# Patient Record
Sex: Male | Born: 1995 | Race: White | Hispanic: No | Marital: Single
Health system: Southern US, Community
[De-identification: ages and names within clinical notes are randomized; demographics above are authoritative.]

## PROBLEM LIST (undated history)

## (undated) DIAGNOSIS — F909 Attention-deficit hyperactivity disorder, unspecified type: Secondary | ICD-10-CM

## (undated) DIAGNOSIS — E301 Precocious puberty: Secondary | ICD-10-CM

## (undated) DIAGNOSIS — F952 Tourette's disorder: Secondary | ICD-10-CM

## (undated) HISTORY — DX: Attention-deficit hyperactivity disorder, unspecified type: F90.9

## (undated) HISTORY — DX: Tourette's disorder: F95.2

## (undated) HISTORY — DX: Precocious puberty: E30.1

## (undated) HISTORY — PX: CIRCUMCISION REVISION: SHX1347

---
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
## 2015-05-13 ENCOUNTER — Ambulatory Visit: Payer: BLUE CROSS/BLUE SHIELD | Admitting: Family

## 2015-10-10 ENCOUNTER — Ambulatory Visit (INDEPENDENT_AMBULATORY_CARE_PROVIDER_SITE_OTHER): Payer: BLUE CROSS/BLUE SHIELD | Admitting: Pediatrics

## 2015-10-10 ENCOUNTER — Encounter: Payer: Self-pay | Admitting: Pediatrics

## 2015-10-10 VITALS — BP 110/80 | Ht 62.75 in | Wt 134.2 lb

## 2015-10-10 DIAGNOSIS — Z0000 Encounter for general adult medical examination without abnormal findings: Secondary | ICD-10-CM | POA: Diagnosis not present

## 2015-10-10 DIAGNOSIS — Z00129 Encounter for routine child health examination without abnormal findings: Secondary | ICD-10-CM

## 2015-10-10 DIAGNOSIS — Z68.41 Body mass index (BMI) pediatric, 5th percentile to less than 85th percentile for age: Secondary | ICD-10-CM

## 2015-10-10 MED ORDER — AMPHETAMINE-DEXTROAMPHETAMINE 20 MG PO TABS: 20.0000 mg | ORAL_TABLET | Freq: Every day | ORAL | Status: DC

## 2015-10-10 MED ORDER — LISDEXAMFETAMINE DIMESYLATE 40 MG PO CAPS: 40.0000 mg | ORAL_CAPSULE | Freq: Every day | ORAL | Status: DC

## 2015-10-10 NOTE — Patient Instructions (Signed)
Attention Deficit Hyperactivity Disorder  Attention deficit hyperactivity disorder (ADHD) is a problem with behavior issues based on the way the brain functions (neurobehavioral disorder). It is a common reason for behavior and academic problems in school.  SYMPTOMS   There are 3 types of ADHD. The 3 types and some of the symptoms include:  · Inattentive.    Gets bored or distracted easily.    Loses or forgets things. Forgets to hand in homework.    Has trouble organizing or completing tasks.    Difficulty staying on task.    An inability to organize daily tasks and school work.    Leaving projects, chores, or homework unfinished.    Trouble paying attention or responding to details. Careless mistakes.    Difficulty following directions. Often seems like is not listening.    Dislikes activities that require sustained attention (like chores or homework).  · Hyperactive-impulsive.    Feels like it is impossible to sit still or stay in a seat. Fidgeting with hands and feet.    Trouble waiting turn.    Talking too much or out of turn. Interruptive.    Speaks or acts impulsively.    Aggressive, disruptive behavior.    Constantly busy or on the go; noisy.    Often leaves seat when they are expected to remain seated.    Often runs or climbs where it is not appropriate, or feels very restless.  · Combined.    Has symptoms of both of the above.  Often children with ADHD feel discouraged about themselves and with school. They often perform well below their abilities in school.  As children get older, the excess motor activities can calm down, but the problems with paying attention and staying organized persist. Most children do not outgrow ADHD but with good treatment can learn to cope with the symptoms.  DIAGNOSIS   When ADHD is suspected, the diagnosis should be made by professionals trained in ADHD. This professional will collect information about the individual suspected of having ADHD. Information must be collected from  various settings where the person lives, works, or attends school.    Diagnosis will include:  · Confirming symptoms began in childhood.  · Ruling out other reasons for the child's behavior.  · The health care providers will check with the child's school and check their medical records.  · They will talk to teachers and parents.  · Behavior rating scales for the child will be filled out by those dealing with the child on a daily basis.  A diagnosis is made only after all information has been considered.  TREATMENT   Treatment usually includes behavioral treatment, tutoring or extra support in school, and stimulant medicines. Because of the way a person's brain works with ADHD, these medicines decrease impulsivity and hyperactivity and increase attention. This is different than how they would work in a person who does not have ADHD. Other medicines used include antidepressants and certain blood pressure medicines.  Most experts agree that treatment for ADHD should address all aspects of the person's functioning. Along with medicines, treatment should include structured classroom management at school. Parents should reward good behavior, provide constant discipline, and set limits. Tutoring should be available for the child as needed.  ADHD is a lifelong condition. If untreated, the disorder can have long-term serious effects into adolescence and adulthood.  HOME CARE INSTRUCTIONS   · Often with ADHD there is a lot of frustration among family members dealing with the condition. Blame   and anger are also feelings that are common. In many cases, because the problem affects the family as a whole, the entire family may need help. A therapist can help the family find better ways to handle the disruptive behaviors of the person with ADHD and promote change. If the person with ADHD is young, most of the therapist's work is with the parents. Parents will learn techniques for coping with and improving their child's behavior.  Sometimes only the child with the ADHD needs counseling. Your health care providers can help you make these decisions.  · Children with ADHD may need help learning how to organize. Some helpful tips include:  ¨ Keep routines the same every day from wake-up time to bedtime. Schedule all activities, including homework and playtime. Keep the schedule in a place where the person with ADHD will often see it. Mark schedule changes as far in advance as possible.  ¨ Schedule outdoor and indoor recreation.  ¨ Have a place for everything and keep everything in its place. This includes clothing, backpacks, and school supplies.  ¨ Encourage writing down assignments and bringing home needed books. Work with your child's teachers for assistance in organizing school work.  · Offer your child a well-balanced diet. Breakfast that includes a balance of whole grains, protein, and fruits or vegetables is especially important for school performance. Children should avoid drinks with caffeine including:  ¨ Soft drinks.  ¨ Coffee.  ¨ Tea.  ¨ However, some older children (adolescents) may find these drinks helpful in improving their attention. Because it can also be common for adolescents with ADHD to become addicted to caffeine, talk with your health care provider about what is a safe amount of caffeine intake for your child.  · Children with ADHD need consistent rules that they can understand and follow. If rules are followed, give small rewards. Children with ADHD often receive, and expect, criticism. Look for good behavior and praise it. Set realistic goals. Give clear instructions. Look for activities that can foster success and self-esteem. Make time for pleasant activities with your child. Give lots of affection.  · Parents are their children's greatest advocates. Learn as much as possible about ADHD. This helps you become a stronger and better advocate for your child. It also helps you educate your child's teachers and instructors  if they feel inadequate in these areas. Parent support groups are often helpful. A national group with local chapters is called Children and Adults with Attention Deficit Hyperactivity Disorder (CHADD).  SEEK MEDICAL CARE IF:  · Your child has repeated muscle twitches, cough, or speech outbursts.  · Your child has sleep problems.  · Your child has a marked loss of appetite.  · Your child develops depression.  · Your child has new or worsening behavioral problems.  · Your child develops dizziness.  · Your child has a racing heart.  · Your child has stomach pains.  · Your child develops headaches.  SEEK IMMEDIATE MEDICAL CARE IF:  · Your child has been diagnosed with depression or anxiety and the symptoms seem to be getting worse.  · Your child has been depressed and suddenly appears to have increased energy or motivation.  · You are worried that your child is having a bad reaction to a medication he or she is taking for ADHD.     This information is not intended to replace advice given to you by your health care provider. Make sure you discuss any questions you have with your   health care provider.     Document Released: 03/02/2002 Document Revised: 03/17/2013 Document Reviewed: 11/17/2012  Elsevier Interactive Patient Education ©2016 [employer] Inc[employer]

## 2015-10-10 NOTE — Progress Notes (Signed)
Adolescent Well Care Visit Jesse Moses is a 20 y.o. male who is here for well care.    PCP:  Georgiann Hahn, MD   History was provided by the patient and grandmother.  Current Issues: Current concerns include  --ADHD  Nutrition: Nutrition/Eating Behaviors: good Adequate calcium in diet?: yes Supplements/ Vitamins: yes  Exercise/ Media: Play any Sports?/ Exercise: yes Screen Time:  < 2 hours Media Rules or Monitoring?: yes  Sleep:  Sleep: 8-10 hours  Social Screening: Lives with:  grandmom Parental relations:  good Activities, Work, and Regulatory affairs officer?: yes Concerns regarding behavior with peers?  no Stressors of note: yes - bipolar  Education:  School Grade: [employer] School performance: doing well; no concerns School Behavior: doing well; no concerns  Menstruation:   No LMP for male patient.     Tobacco?  no Secondhand smoke exposure?  no Drugs/ETOH?  no  Sexually Active?  no    Safe at home, in school & in relationships?  Yes Safe to self?  Yes   Screenings: Patient has a dental home: yes  The patient completed the Rapid Assessment for Adolescent Preventive Services screening questionnaire and the following topics were identified as risk factors and discussed: healthy eating, exercise, seatbelt use, bullying, abuse/trauma, weapon use, tobacco use, marijuana use, drug use, condom use, birth control, sexuality, suicidality/self harm, mental health issues, social isolation, school problems, family problems and screen time    PHQ-9 completed and results indicated No Risks  Physical Exam:  Filed Vitals:   10/10/15 1021  BP: 110/80  Height: 5' 2.75" (1.594 m)  Weight: 134 lb 3.2 oz (60.873 kg)   BP 110/80 mmHg  Ht 5' 2.75" (1.594 m)  Wt 134 lb 3.2 oz (60.873 kg)  BMI 23.96 kg/m2 Body mass index: body mass index is 23.96 kg/(m^2). Blood pressure percentiles are 24% systolic and 62% diastolic based on 2000 NHANES data. Blood pressure  percentile targets: 90: 131/91, 95: 135/95, 99 + 5 mmHg: 147/108.   Hearing Screening           Right ear:   Left ear:   Visual Acuity Screening   Right eye Left eye Both eyes  Without correction:     With correction: 10/10 10/10     General Appearance:   alert, oriented, no acute distress and well nourished  HENT: Normocephalic, no obvious abnormality, conjunctiva clear  Mouth:   Normal appearing teeth, no obvious discoloration, dental caries, or dental caps  Neck:   Supple; thyroid: no enlargement, symmetric, no tenderness/mass/nodules     Lungs:   Clear to auscultation bilaterally, normal work of breathing  Heart:   Regular rate and rhythm, S1 and S2 normal, no murmurs;   Abdomen:   Soft, non-tender, no mass, or organomegaly  GU normal male genitals, no testicular masses or hernia  Musculoskeletal:   Tone and strength strong and symmetrical, all extremities               Lymphatic:   No cervical adenopathy  Skin/Hair/Nails:   Skin warm, dry and intact, no rashes, no bruises or petechiae  Neurologic:   Strength, gait, and coordination normal and age-appropriate     Assessment and Plan:   Well Adolescent  BMI is appropriate for age  Hearing screening result:normal Vision screening result: normal  Counseling provided for all of the vaccine components No orders of the defined types were placed  in this encounter.     Return in about 1 year (around 10/09/2016).Marland Kitchen.  Georgiann HahnAMGOOLAM, Delmus Warwick, MD

## 2015-10-31 ENCOUNTER — Emergency Department (HOSPITAL_BASED_OUTPATIENT_CLINIC_OR_DEPARTMENT_OTHER): Payer: Worker's Compensation

## 2015-10-31 ENCOUNTER — Emergency Department (HOSPITAL_BASED_OUTPATIENT_CLINIC_OR_DEPARTMENT_OTHER)
Admission: EM | Admit: 2015-10-31 | Discharge: 2015-10-31 | Disposition: A | Discharge status: Home / Self Care | Payer: Worker's Compensation | Attending: Emergency Medicine | Admitting: Emergency Medicine

## 2015-10-31 ENCOUNTER — Encounter (HOSPITAL_BASED_OUTPATIENT_CLINIC_OR_DEPARTMENT_OTHER): Payer: Self-pay | Admitting: Emergency Medicine

## 2015-10-31 DIAGNOSIS — W275XXA Contact with paper-cutter, initial encounter: Secondary | ICD-10-CM | POA: Insufficient documentation

## 2015-10-31 DIAGNOSIS — Y990 Civilian activity done for income or pay: Secondary | ICD-10-CM | POA: Insufficient documentation

## 2015-10-31 DIAGNOSIS — F909 Attention-deficit hyperactivity disorder, unspecified type: Secondary | ICD-10-CM | POA: Insufficient documentation

## 2015-10-31 DIAGNOSIS — Z7722 Contact with and (suspected) exposure to environmental tobacco smoke (acute) (chronic): Secondary | ICD-10-CM | POA: Insufficient documentation

## 2015-10-31 DIAGNOSIS — Y929 Unspecified place or not applicable: Secondary | ICD-10-CM | POA: Insufficient documentation

## 2015-10-31 DIAGNOSIS — Z23 Encounter for immunization: Secondary | ICD-10-CM | POA: Diagnosis not present

## 2015-10-31 DIAGNOSIS — S61012A Laceration without foreign body of left thumb without damage to nail, initial encounter: Secondary | ICD-10-CM | POA: Insufficient documentation

## 2015-10-31 DIAGNOSIS — Z79899 Other long term (current) drug therapy: Secondary | ICD-10-CM | POA: Insufficient documentation

## 2015-10-31 DIAGNOSIS — Y9389 Activity, other specified: Secondary | ICD-10-CM | POA: Insufficient documentation

## 2015-10-31 DIAGNOSIS — S61219A Laceration without foreign body of unspecified finger without damage to nail, initial encounter: Secondary | ICD-10-CM

## 2015-10-31 DIAGNOSIS — S6992XA Unspecified injury of left wrist, hand and finger(s), initial encounter: Secondary | ICD-10-CM | POA: Diagnosis present

## 2015-10-31 MED ORDER — BUPIVACAINE HCL 0.5 % IJ SOLN
5.0000 mL | Freq: Once | INTRAMUSCULAR | Status: AC
  Administered 2015-10-31: 5 mL
  Filled 2015-10-31: qty 1

## 2015-10-31 MED ORDER — TETANUS-DIPHTH-ACELL PERTUSSIS 5-2.5-18.5 LF-MCG/0.5 IM SUSP
0.5000 mL | Freq: Once | INTRAMUSCULAR | Status: AC
  Administered 2015-10-31: 0.5 mL via INTRAMUSCULAR
  Filled 2015-10-31: qty 0.5

## 2015-10-31 MED ORDER — LIDOCAINE HCL 2 % IJ SOLN
5.0000 mL | Freq: Once | INTRAMUSCULAR | Status: AC
  Administered 2015-10-31: 100 mg
  Filled 2015-10-31: qty 20

## 2015-10-31 MED ORDER — IBUPROFEN 400 MG PO TABS
400.0000 mg | ORAL_TABLET | Freq: Once | ORAL | Status: AC | PRN
  Administered 2015-10-31: 400 mg via ORAL
  Filled 2015-10-31: qty 1

## 2015-10-31 MED ORDER — OXYCODONE-ACETAMINOPHEN 5-325 MG PO TABS: 1.0000 | ORAL_TABLET | Freq: Four times a day (QID) | ORAL | 0 refills | Status: AC | PRN

## 2015-10-31 MED ORDER — NAPROXEN 500 MG PO TABS: 500.0000 mg | ORAL_TABLET | Freq: Two times a day (BID) | ORAL | 0 refills | Status: AC

## 2015-10-31 NOTE — ED Provider Notes (Signed)
[facility]   CSN: [id number] Arrival date & time: 10/31/15  1549  First Provider Contact:   First MD Initiated Contact with Patient 10/31/15 1733      By signing my name below, I, Emmanuella Mensah, attest that this documentation has been prepared under the direction and in the presence of Prerna Harold, PA-C. Electronically Signed: Angelene GiovanniEmmanuella Mensah, ED Scribe. 10/31/15. 5:41 PM.   History   Chief Complaint Chief Complaint  Patient presents with  . Laceration  . Finger Injury    HPI Comments: Jesse Moses is a 20 y.o. male who presents to the Emergency Department complaining of laceration to his left thumb that occurred PTA while at work. Pt explains that he was working with a paper cutter with his right hand when he accidentally lacerated his left thumb. Bleeding is currently being controlled by a gauze and ice. Pt has not tried any medications PTA. He is unsure of last tetanus vaccine. He reports NKDA.  Denies neuro deficits or other complaints or injuries.   The history is provided by the patient. No language interpreter was used.    Past Medical History:  Diagnosis Date  . ADHD (attention deficit hyperactivity disorder)   . Early puberty   . Tourette syndrome     Patient Active Problem List   Diagnosis Date Noted  . Well child check 10/10/2015  . ADHD (attention deficit hyperactivity disorder) 11/05/2011  . Short stature for age 07/12/2011  . Tourette syndrome 01/22/2011    Past Surgical History:  Procedure Laterality Date  . CIRCUMCISION REVISION         Home Medications    Prior to Admission medications   Medication Sig Start Date End Date Taking? Authorizing Provider  amphetamine-dextroamphetamine (ADDERALL) 20 MG tablet Take 1 tablet (20 mg total) by mouth daily. 12/11/15 01/11/16 Yes Georgiann HahnAndres Ramgoolam, MD  lisdexamfetamine (VYVANSE) 40 MG capsule Take 1 capsule (40 mg total) by mouth daily with breakfast. 12/11/15  Yes Georgiann HahnAndres  Ramgoolam, MD  Multiple Vitamin (MULITIVITAMIN WITH MINERALS) TABS Take 1 tablet by mouth daily.   Yes Historical Provider, MD  hydrOXYzine (ATARAX/VISTARIL) 25 MG tablet Take 1 tablet (25 mg total) by mouth daily with supper. 04/07/12   Georgiann HahnAndres Ramgoolam, MD  mometasone (NASONEX) 50 MCG/ACT nasal spray Place 2 sprays into the nose daily. 05/19/12   Georgiann HahnAndres Ramgoolam, MD  naproxen (NAPROSYN) 500 MG tablet Take 1 tablet (500 mg total) by mouth 2 (two) times daily. 10/31/15   Jaydrian Corpening C Casanova Schurman, PA-C  oxyCODONE-acetaminophen (PERCOCET/ROXICET) 5-325 MG tablet Take 1 tablet by mouth every 6 (six) hours as needed for severe pain. 10/31/15   Anselm PancoastShawn C Wenceslaus Gist, PA-C    Family History Family History  Problem Relation Age of Onset  . Hypertension Maternal Grandfather   . Short stature Mother   . Thyroid disease Neg Hx     Social History Social History  Substance Use Topics  . Smoking status: Passive Smoke Exposure - Never Smoker  . Smokeless tobacco: Never Used  . Alcohol use Yes     Comment: acute alcohol intoxication x 1 in ER     Allergies   Review of patient's allergies indicates no known allergies.   Review of Systems Review of Systems  Constitutional: Negative for chills.  Musculoskeletal: Positive for arthralgias (left thumb).  Skin: Positive for wound.  Neurological: Negative for weakness and numbness.     Physical Exam Updated Vital Signs BP 140/84 (BP Location: Right Arm)   Pulse  108   Temp 98 F (36.7 C) (Oral)   Resp 22   Ht  (1.651 m)   Wt 127 lb (57.6 kg)   SpO2 98%   BMI 21.13 kg/m   Physical Exam  Constitutional: He appears well-developed and well-nourished. No distress.  HENT:  Head: Normocephalic and atraumatic.  Eyes: Conjunctivae are normal.  Neck: Neck supple.  Cardiovascular: Normal rate and regular rhythm.   Pulmonary/Chest: Effort normal.  Musculoskeletal: He exhibits tenderness. He exhibits no edema or deformity.  Full range of motion in the left hand and  fingers.  Neurological: He is alert.  No sensory deficits. Strength in the left hand and fingers 5/5.   Skin: Skin is warm and dry. He is not diaphoretic.  Complete avulsion to medial distal tip of left thumb. No exposed bone. Some nailbed involvement.  Psychiatric: He has a normal mood and affect. His behavior is normal.  Nursing note and vitals reviewed.    ED Treatments / Results  DIAGNOSTIC STUDIES: Oxygen Saturation is 98% on RA, normal by my interpretation.    COORDINATION OF CARE: 5:38 PM- Pt advised of plan for treatment and pt agrees. Explained that pt's presentation does not warrant a laceration repair. Pt will receive nerve block and bandage.    Labs (all labs ordered are listed, but only abnormal results are displayed) Labs Reviewed - No data to display  EKG  EKG Interpretation None       Radiology Dg Finger Thumb Left  Result Date: 10/31/2015 CLINICAL DATA:  Left thumb laceration with paper cutter. EXAM: LEFT THUMB 2+V COMPARISON:  None. FINDINGS: There is no evidence of fracture or dislocation. There is no evidence of arthropathy or other focal bone abnormality. No radiopaque foreign body is noted. IMPRESSION: No fracture or dislocation is noted. No radiopaque foreign body is noted. Electronically Signed   By: Lupita Raider, M.D.   On: 10/31/2015 16:22    Procedures .Nerve Block Date/Time: 10/31/2015 5:46 PM Performed by: Anselm Pancoast Authorized by: Anselm Pancoast   Consent:    Consent obtained:  Verbal   Consent given by:  Patient   Risks discussed:  Pain, swelling, infection and unsuccessful block   Alternatives discussed:  No treatment Indications:    Indications:  Pain relief Location:    Body area:  Upper extremity   Upper extremity nerve blocked: Digital.   Laterality:  Left Pre-procedure details:    Skin preparation:  Alcohol Skin anesthesia (see MAR for exact dosages):    Skin anesthesia method:  None Procedure details (see MAR for exact  dosages):    Block needle gauge:  25 G   Anesthetic injected:  Bupivacaine 0.5% w/o epi and lidocaine 2% w/o epi   Injection procedure:  Anatomic landmarks identified, incremental injection, negative aspiration for blood, anatomic landmarks palpated and introduced needle   Paresthesia:  None Post-procedure details:    Dressing:  None   Outcome:  Pain improved   Patient tolerance of procedure:  Tolerated well, no immediate complications     (including critical care time)  Medications Ordered in ED Medications  ibuprofen (ADVIL,MOTRIN) tablet 400 mg (400 mg Oral Given 10/31/15 1621)  lidocaine (XYLOCAINE) 2 % (with pres) injection 100 mg (100 mg Infiltration Given 10/31/15 1746)  bupivacaine (MARCAINE) 0.5 % (with pres) injection 5 mL (5 mLs Infiltration Given 10/31/15 1746)  Tdap (BOOSTRIX) injection 0.5 mL (0.5 mLs Intramuscular Given 10/31/15 1832)     Initial Impression / Assessment and  Plan / ED Course  Harolyn Rutherford, PA-C has reviewed the triage vital signs and the nursing notes.  Pertinent labs & imaging results that were available during my care of the patient were reviewed by me and considered in my medical decision making (see chart for details).  Clinical Course    Jesse Moses presents with a left thumb injury that occurred just prior to arrival.  No suturable wound. No neuro or functional deficits. Patient to follow up with hand surgery for any necessary cosmetic issues. Wound care and return precautions discussed. Patient voiced understanding of these instructions and is comfortable with discharge.  Vitals:   10/31/15 1602 10/31/15 1615 10/31/15 1827  BP: 140/83 140/84 143/76  Pulse: 70 108 68  Resp: 22 22 18   Temp: 98 F (36.7 C) 98 F (36.7 C)   TempSrc: Oral Oral   SpO2: 99% 98% 100%  Weight: 60.8 kg 57.6 kg   Height: 5\' 4"  (1.626 m) 5\' 5"  (1.651 m)      Final Clinical Impressions(s) / ED Diagnoses   Final diagnoses:  Finger laceration, initial encounter    I personally performed the services described in this documentation, which was scribed in my presence. The recorded information has been reviewed and is accurate.   New Prescriptions Discharge Medication List as of 10/31/2015  5:57 PM    START taking these medications   Details  naproxen (NAPROSYN) 500 MG tablet Take 1 tablet (500 mg total) by mouth 2 (two) times daily., Starting Mon 10/31/2015, Print    oxyCODONE-acetaminophen (PERCOCET/ROXICET) 5-325 MG tablet Take 1 tablet by mouth every 6 (six) hours as needed for severe pain., Starting Mon 10/31/2015, Print         Anselm Pancoast, PA-C 11/01/15 1543    Doug Sou, MD 11/01/15 [phone]

## 2015-10-31 NOTE — ED Notes (Signed)
Bleeding controlled finger wrapped in tape. Ice applied.

## 2015-10-31 NOTE — Discharge Instructions (Signed)
You have been seen today for a finger injury. There were no abnormalities on the xray. Refer to the attached literature for further wound care instructions. Follow up with hand surgery for any cosmetic worries associated with healing. Return to the ED as needed.    Remove the bandage after 24 hours. You must wait at least 8 hours after the wound repair to wash the wound. Clean the wound and surrounding area gently with tap water and mild soap. Rinse well and blot dry. Do not scrub the wound, as this may cause the wound edges to come apart. You may shower, but avoid submerging the wound, such as with a bath or swimming. Clean the wound daily to prevent infection. Reapplication of a topical antibiotic ointment, such as Neosporin, will decrease scab formation and reduce any scarring. You may use Tylenol, naproxen, ibuprofen for pain.  Return to the ED sooner should signs of infection arise, such as spreading redness, puffiness/swelling, pus draining from the wound, severe increase in pain, or any other major issues.

## 2015-10-31 NOTE — ED Notes (Signed)
PA at bedside.

## 2015-10-31 NOTE — ED Triage Notes (Signed)
Pt sliced tip of thumb with paper cutter today while at work. 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

## 2016-06-05 ENCOUNTER — Telehealth: Payer: Self-pay | Admitting: Pediatrics

## 2016-06-05 NOTE — Telephone Encounter (Signed)
Appeal for vyvanse denial sent

## 2016-06-06 ENCOUNTER — Encounter: Payer: Self-pay | Admitting: Pediatrics

## 2016-06-06 ENCOUNTER — Ambulatory Visit (INDEPENDENT_AMBULATORY_CARE_PROVIDER_SITE_OTHER): Payer: Self-pay | Admitting: Pediatrics

## 2016-06-06 VITALS — BP 116/74 | Ht 64.0 in | Wt 127.0 lb

## 2016-06-06 DIAGNOSIS — F902 Attention-deficit hyperactivity disorder, combined type: Secondary | ICD-10-CM

## 2016-06-06 MED ORDER — LISDEXAMFETAMINE DIMESYLATE 30 MG PO CAPS: 30.0000 mg | ORAL_CAPSULE | Freq: Every day | ORAL | 0 refills | Status: DC

## 2016-06-06 MED ORDER — AMPHETAMINE-DEXTROAMPHETAMINE 20 MG PO TABS: 20.0000 mg | ORAL_TABLET | Freq: Every day | ORAL | 0 refills | Status: DC

## 2016-06-06 NOTE — Patient Instructions (Signed)

## 2016-06-06 NOTE — Progress Notes (Signed)
ADHD meds refilled after normal weight and Blood pressure. Doing well on present dose. See again in 3 months  

## 2016-06-20 ENCOUNTER — Telehealth: Payer: Self-pay | Admitting: Pediatrics

## 2016-06-20 NOTE — Telephone Encounter (Signed)
Mom is calling about the status of the vyvance RX please

## 2016-06-21 MED ORDER — LISDEXAMFETAMINE DIMESYLATE 30 MG PO CAPS: 30.0000 mg | ORAL_CAPSULE | Freq: Every day | ORAL | 0 refills | Status: DC

## 2016-06-21 MED ORDER — AMPHETAMINE-DEXTROAMPHET ER 30 MG PO CP24
30.0000 mg | ORAL_CAPSULE | Freq: Every day | ORAL | 0 refills | Status: DC
Start: 2016-06-21 — End: 2016-11-06

## 2016-06-28 NOTE — Telephone Encounter (Signed)
Insurance denied vyvanse--awaiting more paper work

## 2016-11-06 ENCOUNTER — Encounter: Payer: Self-pay | Admitting: Pediatrics

## 2016-11-06 ENCOUNTER — Ambulatory Visit (INDEPENDENT_AMBULATORY_CARE_PROVIDER_SITE_OTHER): Payer: Self-pay | Admitting: Pediatrics

## 2016-11-06 VITALS — BP 90/62 | Ht 63.5 in | Wt 130.2 lb

## 2016-11-06 DIAGNOSIS — F902 Attention-deficit hyperactivity disorder, combined type: Secondary | ICD-10-CM

## 2016-11-06 MED ORDER — AMPHETAMINE-DEXTROAMPHETAMINE 20 MG PO TABS: 20.0000 mg | ORAL_TABLET | Freq: Every day | ORAL | 0 refills | Status: DC

## 2016-11-06 MED ORDER — LISDEXAMFETAMINE DIMESYLATE 30 MG PO CAPS: 30.0000 mg | ORAL_CAPSULE | Freq: Every day | ORAL | 0 refills | Status: DC

## 2016-11-06 NOTE — Progress Notes (Signed)
ADHD meds refilled after normal weight and Blood pressure. Doing well on present dose. See again in 3 months  

## 2016-11-06 NOTE — Patient Instructions (Signed)

## 2017-04-12 ENCOUNTER — Encounter: Payer: Self-pay | Admitting: Pediatrics

## 2017-04-12 ENCOUNTER — Ambulatory Visit (INDEPENDENT_AMBULATORY_CARE_PROVIDER_SITE_OTHER): Payer: Managed Care, Other (non HMO) | Admitting: Pediatrics

## 2017-04-12 VITALS — BP 110/70 | Ht 63.5 in | Wt 141.6 lb

## 2017-04-12 DIAGNOSIS — F909 Attention-deficit hyperactivity disorder, unspecified type: Secondary | ICD-10-CM

## 2017-04-12 DIAGNOSIS — Z0000 Encounter for general adult medical examination without abnormal findings: Secondary | ICD-10-CM

## 2017-04-12 DIAGNOSIS — Z00129 Encounter for routine child health examination without abnormal findings: Principal | ICD-10-CM

## 2017-04-12 MED ORDER — AMPHETAMINE-DEXTROAMPHETAMINE 20 MG PO TABS: 20.0000 mg | ORAL_TABLET | Freq: Every day | ORAL | 0 refills | Status: DC

## 2017-04-12 MED ORDER — LISDEXAMFETAMINE DIMESYLATE 30 MG PO CAPS: 30.0000 mg | ORAL_CAPSULE | Freq: Every day | ORAL | 0 refills | Status: DC

## 2017-04-12 NOTE — Progress Notes (Signed)
Adolescent Well Care Visit Jesse Moses is a 22 y.o. male who is here for well care.    PCP:  Georgiann Hahn, MD   History was provided by the patient.  Confidentiality was discussed with the patient and, if applicable, with caregiver as well. PCP:  Georgiann Hahn, MD   History was provided by the patient and mother.  Current Issues: Current concerns include: ADHD  Nutrition: Nutrition/Eating Behaviors: good Adequate calcium in diet?: yes Supplements/ Vitamins: yes  Exercise/ Media: Play any Sports?/ Exercise: yes Screen Time:  < 2 hours Media Rules or Monitoring?: yes  Sleep:  Sleep: 8-10 hours  Social Screening: Lives with:  parents Parental relations:  good Activities, Work, and Regulatory affairs officer?: yes Concerns regarding behavior with peers?  no Stressors of note: no  Education:  School GradeMining engineer: doing well; no concerns School Behavior: doing well; no concerns  Menstruation:   No LMP for male patient.    Tobacco?  no Secondhand smoke exposure?  no Drugs/ETOH?  no  Sexually Active?  no     Safe at home, in school & in relationships?  Yes Safe to self?  Yes   Screenings: Patient has a dental home: yes  The patient completed the Rapid Assessment for Adolescent Preventive Services screening questionnaire and the following topics were identified as risk factors and discussed: healthy eating, exercise, seatbelt use, bullying, abuse/trauma, weapon use, tobacco use, marijuana use, drug use, condom use, birth control, sexuality, suicidality/self harm, mental health issues, social isolation, school problems, family problems and screen time    PHQ-9 completed and results indicated --no risk  Physical Exam:  Vitals:   04/12/17 1232  BP: 110/70  Weight: 141 lb 9.6 oz (64.2 kg)  Height: 5' 3.5" (1.613 m)   BP 110/70   Ht 5' 3.5" (1.613 m)   Wt 141 lb 9.6 oz (64.2 kg)   BMI 24.69 kg/m  Body mass index: body mass index is 24.69  kg/m. Growth percentile SmartLinks can only be used for patients less than 48 years old.   Hearing Screening   125Hz  250Hz  500Hz  1000Hz  2000Hz  3000Hz  4000Hz  6000Hz  8000Hz   Right ear:   20 20 20 20 20     Left ear:   20 20 20 20 20       Visual Acuity Screening   Right eye Left eye Both eyes  Without correction:     With correction: 10/10 10/10     General Appearance:   alert, oriented, no acute distress and well nourished  HENT: Normocephalic, no obvious abnormality, conjunctiva clear  Mouth:   Normal appearing teeth, no obvious discoloration, dental caries, or dental caps  Neck:   Supple; thyroid: no enlargement, symmetric, no tenderness/mass/nodules  Chest normal  Lungs:   Clear to auscultation bilaterally, normal work of breathing  Heart:   Regular rate and rhythm, S1 and S2 normal, no murmurs;   Abdomen:   Soft, non-tender, no mass, or organomegaly  GU genitalia not examined  Musculoskeletal:   Tone and strength strong and symmetrical, all extremities               Lymphatic:   No cervical adenopathy  Skin/Hair/Nails:   Skin warm, dry and intact, no rashes, no bruises or petechiae  Neurologic:   Strength, gait, and coordination normal and age-appropriate     Assessment and Plan:   Well male exam--ADHD  BMI is appropriate for age  Hearing screening result:normal Vision screening result: normal   Return in  about 1 year (around 04/12/2018).Georgiann Hahn.  Jesse Meas, MD

## 2017-04-12 NOTE — Patient Instructions (Signed)
Preventive Care for [place], Male The transition to life after high school as a young adult can be a stressful time with many changes. You may start seeing a primary care physician instead of a pediatrician. This is the time when your health care becomes your responsibility. Preventive care refers to lifestyle choices and visits with your health care provider that can promote health and wellness. What does preventive care include?  A yearly physical exam. This is also called an annual wellness visit.  Dental exams once or twice a year.  Routine eye exams. Ask your health care provider how often you should have your eyes checked.  Personal lifestyle choices, including: ? Daily care of your teeth and gums. ? Regular physical activity. ? Eating a healthy diet. ? Avoiding tobacco and drug use. ? Avoiding or limiting alcohol use. ? Practicing safe sex. What happens during an annual wellness visit? Preventive care starts with a yearly visit to your primary care physician. The services and screenings done by your health care provider during your annual wellness visit will depend on your overall health, lifestyle risk factors, and family history of disease. Counseling Your health care provider may ask you questions about:  Past medical problems and your family's medical history.  Medicines or supplements that you take.  Health insurance and access to health care.  Alcohol, tobacco, and drug use, including use of any bodybuilding drugs (anabolic steroids).  Your safety at home, work, or school.  Access to firearms.  Emotional well-being and how you cope with stress.  Relationship well-being.  Diet, exercise, and sleep habits.  Your sexual health and activity.  Screening You may have the following tests or measurements:  Height, weight, and BMI.  Blood pressure.  Lipid and cholesterol levels.  Tuberculosis skin test.  Skin exam.  Vision and hearing tests.  Genital  exam to check for testicular cancer or hernias.  Screening test for hepatitis.  Screening tests for STDs (sexually transmitted diseases), if you are at risk.  Vaccines Your health care provider may recommend certain vaccines, such as:  Influenza vaccine. This is recommended every year.  Tetanus, diphtheria, and acellular pertussis (Tdap, Td) vaccine. You may need a Td booster every 10 years.  Varicella vaccine. You may need this if you have not been vaccinated.  HPV vaccine. If you are 8 or younger, you may need three doses over 6 months.  Measles, mumps, and rubella (MMR) vaccine. You may need at least one dose of MMR. You may also need a second dose.  Pneumococcal 13-valent conjugate (PCV13) vaccine. You may need this if you have certain conditions and have not been vaccinated.  Pneumococcal polysaccharide (PPSV23) vaccine. You may need one or two doses if you smoke cigarettes or if you have certain conditions.  Meningococcal vaccine. One dose is recommended if you are age 83-21 years and a first-year college student living in a residence hall, or if you have one of several medical conditions. You may also need additional booster doses.  Hepatitis A vaccine. You may need this if you have certain conditions or if you travel or work in places where you may be exposed to hepatitis A.  Hepatitis B vaccine. You may need this if you have certain conditions or if you travel or work in places where you may be exposed to hepatitis B.  Haemophilus influenzae type b (Hib) vaccine. You may need this if you have certain risk factors.  Talk to your health care provider about which  screenings and vaccines you need and how often you need them. What steps can I take to develop healthy behaviors?  Have regular preventive health care visits with your primary care physician and dentist.  Eat a healthy diet.  Drink enough fluid to keep your urine clear or pale yellow.  Stay active. Exercise at  least 30 minutes 5 or more days of the week.  Use alcohol responsibly.  Maintain a healthy weight.  Do not use any products that contain nicotine, such as cigarettes, chewing tobacco, and e-cigarettes. If you need help quitting, ask your health care provider.  Do not use drugs.  Practice safe sex. This includes using condoms to prevent STDs or an unwanted pregnancy.  Find healthy ways to manage stress. How can I protect myself from injury? Injuries from violence or accidents are the leading cause of death among young adults and can often be prevented. Take these steps to help protect yourself:  Always wear your seat belt while driving or riding in a vehicle.  Do not drive if you have been drinking alcohol. Do not ride with someone who has been drinking.  Do not drive when you are tired or distracted. Do not text while driving.  Wear a helmet and other protective equipment during sports activities.  If you have firearms in your house, make sure you follow all gun safety procedures.  Seek help if you have been bullied, physically abused, or sexually abused.  Avoid fighting.  Use the Internet responsibly to avoid dangers such as online bullying.  What can I do to cope with stress? Young adults may face many new challenges that can be stressful, such as finding a job, going to college, moving away from home, managing money, being in a relationship, getting married, and having children. To manage stress:  Avoid known stressful situations when you can.  Exercise regularly.  Find a stress-reducing activity that works best for you. Examples include meditation, yoga, listening to music, or reading.  Spend time in nature.  Keep a journal to write about your stress and how you respond.  Talk to your health care provider about stress. He or she may suggest counseling.  Spend time with supportive friends or family.  Do not cope with stress by: ? Drinking alcohol or using  drugs. ? Smoking cigarettes. ? Eating.  Where can I get more information? Learn more about preventive care and healthy habits from:  U.S. Preventive Services Task Force: StageSync.si  National Adolescent and [place]: StrategicRoad.nl  American Academy of Pediatrics Bright Futures: https://brightfutures.MemberVerification.co.za  Society for Adolescent Health and Medicine: MoralBlog.co.za.aspx  PodExchange.nl: ToyLending.fr  This information is not intended to replace advice given to you by your health care provider. Make sure you discuss any questions you have with your health care provider. Document Released: 07/28/2015 Document Revised: 08/18/2015 Document Reviewed: 07/28/2015 Elsevier Interactive Patient Education  [employer].

## 2017-07-25 ENCOUNTER — Telehealth: Payer: Self-pay | Admitting: Pediatrics

## 2017-07-25 NOTE — Telephone Encounter (Signed)
Letter for school 

## 2017-07-25 NOTE — Telephone Encounter (Signed)
Note for school--excused from classes

## 2018-02-05 ENCOUNTER — Telehealth: Payer: Self-pay | Admitting: Pediatrics

## 2018-02-05 NOTE — Telephone Encounter (Signed)
Letter on ADHD written

## 2018-02-05 NOTE — Telephone Encounter (Signed)
Mom called and needs a letter saying Jesse Moses is being treated for ADD  And [phone] please

## 2018-03-04 DIAGNOSIS — H5213 Myopia, bilateral: Secondary | ICD-10-CM | POA: Diagnosis not present

## 2018-03-04 DIAGNOSIS — H52221 Regular astigmatism, right eye: Secondary | ICD-10-CM | POA: Diagnosis not present

## 2018-08-25 ENCOUNTER — Other Ambulatory Visit: Payer: Self-pay

## 2018-08-25 ENCOUNTER — Ambulatory Visit (INDEPENDENT_AMBULATORY_CARE_PROVIDER_SITE_OTHER): Payer: BLUE CROSS/BLUE SHIELD | Admitting: Pediatrics

## 2018-08-25 ENCOUNTER — Encounter: Payer: Self-pay | Admitting: Pediatrics

## 2018-08-25 VITALS — BP 102/68 | Ht 63.75 in | Wt 140.2 lb

## 2018-08-25 DIAGNOSIS — Z0001 Encounter for general adult medical examination with abnormal findings: Secondary | ICD-10-CM

## 2018-08-25 DIAGNOSIS — Z68.41 Body mass index (BMI) pediatric, 5th percentile to less than 85th percentile for age: Secondary | ICD-10-CM | POA: Diagnosis not present

## 2018-08-25 DIAGNOSIS — F900 Attention-deficit hyperactivity disorder, predominantly inattentive type: Secondary | ICD-10-CM | POA: Diagnosis not present

## 2018-08-25 DIAGNOSIS — Z0000 Encounter for general adult medical examination without abnormal findings: Secondary | ICD-10-CM

## 2018-08-25 MED ORDER — LISDEXAMFETAMINE DIMESYLATE 30 MG PO CAPS: 30.0000 mg | ORAL_CAPSULE | Freq: Every day | ORAL | 0 refills | Status: DC

## 2018-08-25 MED ORDER — AMPHETAMINE-DEXTROAMPHETAMINE 20 MG PO TABS: 20.0000 mg | ORAL_TABLET | Freq: Every day | ORAL | 0 refills | Status: DC

## 2018-08-25 NOTE — Patient Instructions (Signed)
Preventive Care for [place], Male The transition to life after high school as a young adult can be a stressful time with many changes. You may start seeing a primary care physician instead of a pediatrician. This is the time when your health care becomes your responsibility. Preventive care refers to lifestyle choices and visits with your health care provider that can promote health and wellness. What does preventive care include?  A yearly physical exam. This is also called an annual wellness visit.  Dental exams once or twice a year.  Routine eye exams. Ask your health care provider how often you should have your eyes checked.  Personal lifestyle choices, including: ? Daily care of your teeth and gums. ? Regular physical activity. ? Eating a healthy diet. ? Avoiding tobacco and drug use. ? Avoiding or limiting alcohol use. ? Practicing safe sex. What happens during an annual wellness visit? Preventive care starts with a yearly visit to your primary care physician. The services and screenings done by your health care provider during your annual wellness visit will depend on your overall health, lifestyle risk factors, and family history of disease. Counseling Your health care provider may ask you questions about:  Past medical problems and your family's medical history.  Medicines or supplements that you take.  Health insurance and access to health care.  Alcohol, tobacco, and drug use, including use of any bodybuilding drugs (anabolic steroids).  Your safety at home, work, or school.  Access to firearms.  Emotional well-being and how you cope with stress.  Relationship well-being.  Diet, exercise, and sleep habits.  Your sexual health and activity. Screening You may have the following tests or measurements:  Height, weight, and BMI.  Blood pressure.  Lipid and cholesterol levels.  Tuberculosis skin test.  Skin exam.  Vision and hearing tests.  Genital  exam to check for testicular cancer or hernias.  Screening test for hepatitis.  Screening tests for STDs (sexually transmitted diseases), if you are at risk. Vaccines Your health care provider may recommend certain vaccines, such as:  Influenza vaccine. This is recommended every year.  Tetanus, diphtheria, and acellular pertussis (Tdap, Td) vaccine. You may need a Td booster every 10 years.  Varicella vaccine. You may need this if you have not been vaccinated.  HPV vaccine. If you are 100 or younger, you may need three doses over 6 months.  Measles, mumps, and rubella (MMR) vaccine. You may need at least one dose of MMR. You may also need a second dose.  Pneumococcal 13-valent conjugate (PCV13) vaccine. You may need this if you have certain conditions and have not been vaccinated.  Pneumococcal polysaccharide (PPSV23) vaccine. You may need one or two doses if you smoke cigarettes or if you have certain conditions.  Meningococcal vaccine. One dose is recommended if you are age 48-21 years and a first-year college student living in a residence hall, or if you have one of several medical conditions. You may also need additional booster doses.  Hepatitis A vaccine. You may need this if you have certain conditions or if you travel or work in places where you may be exposed to hepatitis A.  Hepatitis B vaccine. You may need this if you have certain conditions or if you travel or work in places where you may be exposed to hepatitis B.  Haemophilus influenzae type b (Hib) vaccine. You may need this if you have certain risk factors. Talk to your health care provider about which screenings and vaccines  you need and how often you need them. What steps can I take to develop healthy behaviors?      Have regular preventive health care visits with your primary care physician and dentist.  Eat a healthy diet.  Drink enough fluid to keep your urine clear or pale yellow.  Stay active. Exercise  at least 30 minutes 5 or more days of the week.  Use alcohol responsibly.  Maintain a healthy weight.  Do not use any products that contain nicotine, such as cigarettes, chewing tobacco, and e-cigarettes. If you need help quitting, ask your health care provider.  Do not use drugs.  Practice safe sex. This includes using condoms to prevent STDs or an unwanted pregnancy.  Find healthy ways to manage stress. How can I protect myself from injury? Injuries from violence or accidents are the leading cause of death among young adults and can often be prevented. Take these steps to help protect yourself:  Always wear your seat belt while driving or riding in a vehicle.  Do not drive if you have been drinking alcohol. Do not ride with someone who has been drinking.  Do not drive when you are tired or distracted. Do not text while driving.  Wear a helmet and other protective equipment during sports activities.  If you have firearms in your house, make sure you follow all gun safety procedures.  Seek help if you have been bullied, physically abused, or sexually abused.  Avoid fighting.  Use the Internet responsibly to avoid dangers such as online bullying. What can I do to cope with stress? Young adults may face many new challenges that can be stressful, such as finding a job, going to college, moving away from home, managing money, being in a relationship, getting married, and having children. To manage stress:  Avoid known stressful situations when you can.  Exercise regularly.  Find a stress-reducing activity that works best for you. Examples include meditation, yoga, listening to music, or reading.  Spend time in nature.  Keep a journal to write about your stress and how you respond.  Talk to your health care provider about stress. He or she may suggest counseling.  Spend time with supportive friends or family.  Do not cope with stress by: ? Drinking alcohol or using drugs.  ? Smoking cigarettes. ? Eating. Where can I get more information? Learn more about preventive care and healthy habits from:  U.S. Preventive Services Task Force: StageSync.si  National Adolescent and [place]: StrategicRoad.nl  American Academy of Pediatrics Bright Futures: https://brightfutures.MemberVerification.co.za  Society for Adolescent Health and Medicine: MoralBlog.co.za.aspx  PodExchange.nl: ToyLending.fr This information is not intended to replace advice given to you by your health care provider. Make sure you discuss any questions you have with your health care provider. Document Released: 07/28/2015 Document Revised: 10/23/2016 Document Reviewed: 07/28/2015 Elsevier Interactive Patient Education  2019 [employer].

## 2018-08-25 NOTE — Progress Notes (Signed)
Refill ADHD meds   Patient ID: Jesse S Gramling, male   DOB: 04/12/1995, 23 y.o.   MRN: [id number]   Adolescent Well Care Visit Jesse Moses is a 23 y.o. male who is here for well care.    PCP:  Georgiann Hahnamgoolam, Leolia Vinzant, MD   History was provided by the patient.  Confidentiality was discussed with the patient and, if applicable, with caregiver as well. Patient's personal or confidential phone number: n/a   Current Issues: Current concerns include ADHD --needs refills.   Nutrition: Nutrition/Eating Behaviors: good Adequate calcium in diet?: yes Supplements/ Vitamins: yes  Exercise/ Media: Play any Sports?/ Exercise: yes Screen Time:  < 2 hours Media Rules or Monitoring?: yes  Sleep:  Sleep: good  Social Screening: Lives with:  parents Parental relations:  good Activities, Work, and Regulatory affairs officerChores?: yes Concerns regarding behavior with peers?  no Stressors of note: no  Education: Graduated College--Finance    Confidential Social History: Tobacco?  no Secondhand smoke exposure?  no Drugs/ETOH?  no    Safe at home, in school & in relationships?  Yes Safe to self?  Yes   Screenings: Patient has a dental home: yes  The patient completed the Rapid Assessment for Adolescent Preventive Services screening questionnaire and the following topics were identified as risk factors and discussed: healthy eating, exercise, seatbelt use, bullying, abuse/trauma, weapon use, tobacco use, marijuana use, drug use, condom use, birth control and sexuality  In addition, the following topics were discussed as part of anticipatory guidance suicidality/self harm, mental health issues, social isolation, school problems, family problems and screen time.  PHQ-9 completed and results indicated negative  Physical Exam:  Vitals:   08/25/18 1248  BP: 102/68  Weight: 140 lb 3.2 oz (63.6 kg)  Height: 5' 3.75" (1.619 m)   BP 102/68   Ht 5' 3.75" (1.619 m)   Wt 140 lb 3.2 oz (63.6 kg)   BMI 24.25  kg/m  Body mass index: body mass index is 24.25 kg/m. Growth percentile SmartLinks can only be used for patients less than 23 years old.   Hearing Screening   125Hz  250Hz  500Hz  1000Hz  2000Hz  3000Hz  4000Hz  6000Hz  8000Hz   Right ear:   20 20 20 20 20     Left ear:   20 20 20 20 20       Visual Acuity Screening   Right eye Left eye Both eyes  Without correction:     With correction: 10/10 10/10     General Appearance:   alert, oriented, no acute distress and well nourished  HENT: Normocephalic, no obvious abnormality, conjunctiva clear  Mouth:   Normal appearing teeth, no obvious discoloration, dental caries, or dental caps  Neck:   Supple; thyroid: no enlargement, symmetric, no tenderness/mass/nodules  Chest normal  Lungs:   Clear to auscultation bilaterally, normal work of breathing  Heart:   Regular rate and rhythm, S1 and S2 normal, no murmurs;   Abdomen:   Soft, non-tender, no mass, or organomegaly  GU genitalia not examined  Musculoskeletal:   Tone and strength strong and symmetrical, all extremities               Lymphatic:   No cervical adenopathy  Skin/Hair/Nails:   Skin warm, dry and intact, no rashes, no bruises or petechiae  Neurologic:   Strength, gait, and coordination normal and age-appropriate     Assessment and Plan:   Well young adult  BMI is appropriate for age  Hearing screening result:normal Vision screening result: normal  Return in about 1 year (around 08/25/2019).Georgiann Hahn, MD

## 2018-09-18 DIAGNOSIS — Z20828 Contact with and (suspected) exposure to other viral communicable diseases: Secondary | ICD-10-CM | POA: Diagnosis not present

## 2019-02-09 ENCOUNTER — Telehealth: Payer: Self-pay | Admitting: Pediatrics

## 2019-02-09 ENCOUNTER — Other Ambulatory Visit: Payer: Self-pay | Admitting: Pediatrics

## 2019-02-09 MED ORDER — LISDEXAMFETAMINE DIMESYLATE 30 MG PO CAPS: 30.0000 mg | ORAL_CAPSULE | Freq: Every day | ORAL | 0 refills | Status: AC

## 2019-02-09 MED ORDER — AMPHETAMINE-DEXTROAMPHETAMINE 20 MG PO TABS: 20.0000 mg | ORAL_TABLET | Freq: Every day | ORAL | 0 refills | Status: AC

## 2019-02-09 MED ORDER — AMPHETAMINE-DEXTROAMPHETAMINE 20 MG PO TABS: 20.0000 mg | ORAL_TABLET | Freq: Every day | ORAL | 0 refills | Status: DC

## 2019-02-09 NOTE — Progress Notes (Signed)
Refilled meds

## 2019-02-09 NOTE — Telephone Encounter (Signed)
Refilled called in--he will come in for meds check when in town--mom asked for [facility] --near [address]

## 2019-02-09 NOTE — Telephone Encounter (Signed)
[place] called and needs a refill adderol 20 mg vyvanse 40 mg called in to [facility] [place]

## 2023-11-17 ENCOUNTER — Telehealth: Payer: Self-pay | Admitting: Pediatrics

## 2023-11-17 MED ORDER — AMPHETAMINE-DEXTROAMPHETAMINE 10 MG PO TABS: 20.0000 mg | ORAL_TABLET | Freq: Two times a day (BID) | ORAL | 0 refills | Status: AC

## 2023-11-17 NOTE — Telephone Encounter (Signed)
 Mom called to get a refill of his ADHD meds --she says he is out of town and the medication is NOT available at any pharmacy in his area. Since he has been a patient of mine would I call in a month supply to a local pharmacy here so she can pick it up and get it to him ---in light of the drug shortage decided to help get him his medication for this one time refill ----if he needs more than this he would need to come in for a medication check prior to more refills. Called in a one time refill as a courtesy.
